# Patient Record
Sex: Female | Born: 1970 | Race: White | Hispanic: No | Marital: Married | Smoking: Never smoker
Health system: Southern US, Community
[De-identification: ages and names within clinical notes are randomized; demographics above are authoritative.]

---

## 2000-04-04 ENCOUNTER — Other Ambulatory Visit: Admission: RE | Admit: 2000-04-04 | Discharge: 2000-04-04 | Payer: Self-pay | Admitting: Obstetrics & Gynecology

## 2000-07-27 ENCOUNTER — Other Ambulatory Visit: Admission: RE | Admit: 2000-07-27 | Discharge: 2000-07-27 | Payer: Self-pay | Admitting: Obstetrics & Gynecology

## 2001-05-09 ENCOUNTER — Other Ambulatory Visit: Admission: RE | Admit: 2001-05-09 | Discharge: 2001-05-09 | Payer: Self-pay | Admitting: Obstetrics and Gynecology

## 2001-08-16 ENCOUNTER — Inpatient Hospital Stay (HOSPITAL_COMMUNITY): Admission: AD | Admit: 2001-08-16 | Discharge: 2001-08-16 | Payer: Self-pay | Admitting: Obstetrics and Gynecology

## 2001-08-17 ENCOUNTER — Encounter (INDEPENDENT_AMBULATORY_CARE_PROVIDER_SITE_OTHER): Payer: Self-pay | Admitting: Specialist

## 2001-08-17 ENCOUNTER — Inpatient Hospital Stay (HOSPITAL_COMMUNITY): Admission: AD | Admit: 2001-08-17 | Discharge: 2001-08-30 | Payer: Self-pay | Admitting: Obstetrics and Gynecology

## 2001-08-17 ENCOUNTER — Encounter: Payer: Self-pay | Admitting: Obstetrics and Gynecology

## 2001-08-21 ENCOUNTER — Encounter: Payer: Self-pay | Admitting: Obstetrics and Gynecology

## 2001-08-23 ENCOUNTER — Encounter: Payer: Self-pay | Admitting: Obstetrics and Gynecology

## 2001-08-27 ENCOUNTER — Encounter: Payer: Self-pay | Admitting: Obstetrics and Gynecology

## 2001-08-31 ENCOUNTER — Encounter: Admission: RE | Admit: 2001-08-31 | Discharge: 2001-09-30 | Payer: Self-pay | Admitting: Obstetrics and Gynecology

## 2002-10-23 ENCOUNTER — Other Ambulatory Visit: Admission: RE | Admit: 2002-10-23 | Discharge: 2002-10-23 | Payer: Self-pay | Admitting: Obstetrics and Gynecology

## 2003-10-23 ENCOUNTER — Other Ambulatory Visit: Admission: RE | Admit: 2003-10-23 | Discharge: 2003-10-23 | Payer: Self-pay | Admitting: Obstetrics and Gynecology

## 2003-11-04 ENCOUNTER — Encounter: Admission: RE | Admit: 2003-11-04 | Discharge: 2003-11-04 | Payer: Self-pay | Admitting: Obstetrics and Gynecology

## 2004-08-05 ENCOUNTER — Ambulatory Visit (HOSPITAL_COMMUNITY): Admission: RE | Admit: 2004-08-05 | Discharge: 2004-08-05 | Payer: Self-pay | Admitting: Obstetrics and Gynecology

## 2004-10-05 ENCOUNTER — Other Ambulatory Visit: Admission: RE | Admit: 2004-10-05 | Discharge: 2004-10-05 | Payer: Self-pay | Admitting: Obstetrics and Gynecology

## 2004-11-11 ENCOUNTER — Ambulatory Visit (HOSPITAL_COMMUNITY): Admission: RE | Admit: 2004-11-11 | Discharge: 2004-11-11 | Payer: Self-pay | Admitting: Obstetrics and Gynecology

## 2005-10-10 ENCOUNTER — Other Ambulatory Visit: Admission: RE | Admit: 2005-10-10 | Discharge: 2005-10-10 | Payer: Self-pay | Admitting: Obstetrics and Gynecology

## 2009-03-27 ENCOUNTER — Encounter: Admission: RE | Admit: 2009-03-27 | Discharge: 2009-03-27 | Payer: Self-pay | Admitting: Obstetrics and Gynecology

## 2010-04-09 ENCOUNTER — Encounter: Admission: RE | Admit: 2010-04-09 | Discharge: 2010-04-09 | Payer: Self-pay | Admitting: Family Medicine

## 2010-04-16 ENCOUNTER — Encounter: Admission: RE | Admit: 2010-04-16 | Discharge: 2010-04-16 | Payer: Self-pay | Admitting: Obstetrics and Gynecology

## 2011-03-29 ENCOUNTER — Other Ambulatory Visit: Payer: Self-pay | Admitting: Obstetrics and Gynecology

## 2011-03-29 DIAGNOSIS — Z1231 Encounter for screening mammogram for malignant neoplasm of breast: Secondary | ICD-10-CM

## 2011-05-13 NOTE — Discharge Summary (Signed)
[facility]  Patient:    Brandi Taylor, Brandi Taylor Visit Number: [id number] MRN: [id number]          Service Type: OBS Location: 910A 9140 01 Attending Physician:  Jaymes Graff A Dictated by:   Saverio Danker, C.N.M. Admit Date:  08/17/2001 Disc. Date: 08/30/01                             Discharge Summary  ADMISSION DIAGNOSES:          1. Intrauterine pregnancy at 24-1/7 weeks.                               2. Premature rupture of membranes.                               3. Complete placenta previa.                               4. Transverse lie.  DISCHARGE DIAGNOSES:          1. Intrauterine pregnancy at 24-1/7 weeks.                               2. Premature rupture of membranes.                               3. Complete placenta previa.                               4. Transverse lie.                               5. Nonreassuring fetal heart rate tracing.                               6. Complete oligohydramnios.                               7. No end diastolic flow on ultrasound.                               8. Status post classical cesarean section for                                  delivery. Infant in the neonatal intensive                                  care unit.                               9. Breast-feeding.  10. Anemia.  PROCEDURES THIS ADMISSION:    Primary classical cesarean section for delivery                               of a viable female infant named Brandi Taylor who weighed                               790 grams and had Apgars of 2 and 6 on                               August 27, 2001 by Dr. Dierdre Forth and                               Vance Gather Duplantis, C.N.M.  HOSPITAL COURSE:              Ms. Szeto is a 40 year old, married white female, gravida 1, para 0, admitted at 24-1/7 weeks with spontaneous rupture of the membranes and complete placenta previa. She was started on  magnesium sulfate therapy for tocolysis while she received betamethasone for fetal lung maturity. After approximately 48 hours on magnesium sulfate therapy, she developed pulmonary edema and required Lasix and that her magnesium sulfate be discontinued. She was subsequently maintained without magnesium sulfate and was on oxygen via nasal cannula to maintain her oxygen saturations greater than 95%. She continued status quo, continuing to leak fluid with very small amount of bleeding throughout August 28 to September 2. On the morning of September 2, she was noted to have variable decelerations with possible late component though contractions were difficult to trace, and once they were more accurately traced, it was felt that this pattern was nonreassuring. She was then given an ultrasound which had her biophysical profile of 0/8 and no end diastolic flow, and it was then recommended that she proceed with a cesarean section for delivery. She and her husband agreed and underwent the same with a classical incision on the uterus for delivery of a viable female infant, Brandi Taylor, who weighed 790 grams and had Apgars of 2 and 6 with a cord pH of 6.93 on August 27, 2001 attended by Dr. Dierdre Forth and Vance Gather Duplantis, C.N.M.  Postoperatively, Ms. Belshe has done well. She is ambulating, voiding, and eating without difficulty. She has significant anemia but it is asymptomatic with no syncope or dizziness. She had a temperature following delivery of 102.1 and was continued on Cefotan IV following delivery and has now been afebrile for greater than 48 hours. She is also ambulating and voiding and tolerating a regular diet without difficulty. She is breast pumping, also without difficulty. Her infant is in stable condition in the NICU. She is deemed ready for discharge today.  DISCHARGE INSTRUCTIONS:       Instructions are as per the [facility] handout.  DISCHARGE  MEDICATIONS:        1. Motrin 600 mg p.o. q.6h. p.r.n. for pain.                               2. Tylox one to two p.o. q.4-6h. p.r.n. for  pain.                               3. Prenatal vitamins daily.  DISCHARGE LABORATORIES:       Hemoglobin is 7.2, WBC count 19.1, and platelets are 343,000.  DISCHARGE FOLLOWUP:           The patient will follow up in four to six weeks at [facility] or p.r.n. Dictated by:   Vance Gather Duplantis, C.N.M. Attending Physician:  Michael Litter DD:  08/30/01 TD:  08/30/01 Job: [id number]

## 2011-05-13 NOTE — Op Note (Signed)
[facility]  Patient:    Brandi Taylor, Brandi Taylor Visit Number: [id number] MRN: [id number]          Service Type: OBS Location: 910A 9140 01 Attending Physician:  Jaymes Graff A Dictated by:   Maris Berger. Pennie Rushing, M.D. Proc. Date: 08/27/01 Admit Date:  08/17/2001                             Operative Report  PREOPERATIVE DIAGNOSES:       1. Intrauterine pregnancy at 25 weeks 4 days.                               2. Preterm premature rupture of membranes.                               3. Placenta previa.                               4. Second trimester bleeding.                               5. Nonreassuring fetal heart rate tracing.                               6. Abnormal Doppler studies.  POSTOPERATIVE DIAGNOSES:      1. Intrauterine pregnancy at 25 weeks 4 days.                               2. Preterm premature rupture of membranes.                               3. Placenta previa.                               4. Second trimester bleeding.                               5. Nonreassuring fetal heart rate tracing.                               6. Abnormal Doppler studies.  OPERATION:                    Primary classical cesarean section and uterine                               curettage.  SURGEON:                      Vanessa P. Pennie Rushing, M.D.  ASSISTANT:                    Myriam Forehand Duplantis, C.N.M.  ANESTHESIA:                   Combination spinal and epidural.  ESTIMATED BLOOD  LOSS:         1000 cc.  COMPLICATIONS:                None.  FINDINGS:                     The patient was delivered of a female infant whose name is Caprice Renshaw weighing 790 grams with Apgars of 2 and 6 at one and five minutes, respectively. The cord pH was 6.93. The uterus, tubes, and ovaries were normal for the gravid state. The placenta contained a marginally inserted cord with membranes detached. The membranous surface of the placenta was slightly green  stained. There was no amniotic fluid. The placenta was implanted posteriorly and across the internal os. The internal os digitally felt to be approximately 4 cm dilated.  DESCRIPTION OF PROCEDURE:     The patient was taken to the operating room after appropriate identification, and after a long discussion had been held with the patient and her husband, concerning the indications for the procedure as well as the risks involved which include, but are not limited to, anesthesia ______ , bleeding, infection, and damage to adjacent to adjacent organs. A particular concern about bleeding with a placenta previa was reviewed as was the probable need for a classical cesarean section because of the babys abnormal lie which this morning on ultrasound was oblique breech. The patient was placed on the operating table and a combination spinal and epidural anesthetic was placed. She was placed in the supine position with a left lateral tilt. The abdomen and perineum were prepped with multiple layers of Betadine. A Foley catheter was inserted into the bladder and connected to straight drainage. The abdomen was draped as a sterile field. After assurance of adequate anesthesia, a transverse incision was made in the abdomen and the abdomen opened in layers. The peritoneum was entered and the bladder blade placed. Evaluation of the placental position and the fetal position revealed that the infant was in oblique breech position with the head toward the maternal left. A midline incision was then made in the uterus down to the level of the endometrial cavity. No fluid egress was noted. The infant was then delivered from the footling breech position and after having the cord clamped and cut, was handed off to the awaiting pediatricians. The appropriate cord blood was drawn, and the placenta was noted to be adherent to the uterus. It was then manually removed and sharp curettage of the endometrial cavity  was undertaken to remove the membranous fragments. This was sent as a separate specimen. The uterus was then closed in two layers. The first layer contained figure-of-eight sutures of 0 Vicryl. A second imbricating layer of interrupted sutures of 0 Vicryl was then placed. Hemostatic sutures of 2-0 Vicryl were placed in the serosa. The serosa was closed with a running interlocking suture of 2-0 Vicryl. The uterus had been exteriorized in order to do the closure of the uterus and was then replaced into the peritoneal cavity. Copious irrigation was carried out and hemostasis was noted to be adequate. The abdominal peritoneum was closed with a running suture of 2-0 Vicryl. The rectus muscles were reapproximated in the midline with a figure-of-eight suture of 2-0 Vicryl. The rectus muscles were noted to be hemostatic and were irrigated. The rectus fascia was closed with a running suture of 0 Vicryl then reinforced on either side of midline with figure-of-eight sutures of 0 Vicryl. The subcutaneous tissue was irrigated  and made hemostatic with Bovie cautery. Skin staples were applied to the skin incision. A sterile dressing was applied. The patient was taken from the operating room to the recovery room in satisfactory condition having tolerated the procedure well with sponge and instrument counts correct. The infant went to the neonatal intensive care unit in guarded condition. Dictated by:   Maris Berger. Pennie Rushing, M.D. Attending Physician:  Michael Litter DD:  08/27/01 TD:  08/27/01 Job: [id number]

## 2011-05-13 NOTE — H&P (Signed)
[facility]  Patient:    Brandi Taylor, Brandi Taylor Visit Number: [id number] MRN: [id number]          Service Type: OBS Location: [facility] Attending Physician:  Jaymes Graff A Dictated by:   Nigel Bridgeman, C.N.M. Adm. Date:  08/17/2001                           History and Physical  DATE OF BIRTH:                Jun 11, 1971  HISTORY OF PRESENT ILLNESS:   Brandi Taylor is a 40 year old gravida 1, para 0 at 24-1/7 weeks, who presents tonight with episodes of leaking since 4 p.m. She denies any pain and has a small amount of serosanguineous discharge.  Her pregnancy has been remarkable for:  1. Marginal previa; 2. History of infertility.  The patient was seen yesterday in maternity admissions for contractions.  She was found to be having no bleeding and was discharged home.  PRENATAL LABORATORIES:        Will be dictated separately.  HISTORY OF PRESENT PREGNANCY: The patient entered care at approximately 10 weeks.  She had an ultrasound at 18 weeks, which showed a marginal previa. Ultrasound was repeated at approximately 23 weeks, which showed the same findings.  The patient had had one episodes previously of spotting, but has had no issues since that time.  OBSTETRICAL HISTORY:          The patient is a primigravida.  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 She had a repeat Pap in August 2001, which was normal.  She reports the usual childhood illnesses.  She had a lymph node removed from the right side of her neck in 1993 due to an infection.  She has no known medication allergies.  SURGICAL HISTORY:             The right lymph node removal.  FAMILY HISTORY:               Her sister had PIH.  Her paternal grandfather had a heart attack.  Her father had a heart murmur.  Her mother had ovarian cancer.  Her maternal aunt had a stroke.  GENETIC HISTORY:              Remarkable for patients father having a  heart murmur and her mother having multiple sclerosis.  SOCIAL HISTORY:               The patient is married to the father of the baby, he is involved and supportive.  His name is Doctor, general practice.  The patient is Caucasian.  She has two years of college.  She is employed at [employer].  Her husband also has two years of college and he is employed in [employer].  She has been followed initially by the certified nurse midwife service, [place].  She denies any alcohol, drug or tobacco use during this pregnancy.  PHYSICAL EXAMINATION:  VITAL SIGNS:                  Stable.  Patient is afebrile.  HEENT:                        Within normal limits.  LUNGS:  Bilateral breath sounds are clear.  HEART:                        Regular rate and rhythm without murmur.  BREASTS:                      Soft and nontender.  ABDOMEN:                      Fundal height is approximately 24 week size. Abdomen is soft and nontender.  Fetal heart rate initially was 160 to 170 with mild variables noted.  This did settle down to a baseline of 150 to 160 after rest.  There were still some occasional mild variables noted.  Uterine contractions were noted to be approximately every 7 minutes and were mild with irritability in between.  The patient was not aware of these contractions. These did space out a bit after IV fluid hydration, but then they did resume.   CERVICAL EXAMINATION:         Sterile speculum examination revealed positive fluid leaking from the vagina that was clear.  Crist Fat was negative, although there was a lot of RBCs on the discharge.  However, there was a moderate amount of a thin serosanguineous drainage noted in the vaginal vault.  The cervix appeared to be closed.  There was no active bleeding noted.  Ultrasound revealed a single intrauterine pregnancy in transverse lie with an AFI of 3.0, which represented less than a fifth percentile.  Cervix was 3.6 cm  long. There was a complete previa noted.  EXTREMITIES:                  Deep tendon reflexes are 2+ without clonus. There was a trace edema noted.  IMPRESSION:                   1. Intrauterine pregnancy at 24-1/7 weeks.                               2. Premature rupture of membranes.                               3. Complete previa.                               4. Transverse lie.  PLAN:                         1. Admit to [facility] per                                  consult with Dr. ______ Normand Sloop as attending                                  physician.                               2. Complete bedrest with Foley catheter  3. Magnesium sulfate therapy.                               4. Antibiotic therapy.                               5. Dexamethasone regimen.                               6. GC, chlamydia and group B strep cultures were                                  sent.                               7. NICU consult.                               8. Issues of prematurity and risk of infection,                                  early delivery, and risks of tocolysis were                                  reviewed with the patient and her husband by                                  Nigel Bridgeman, C.N.M. and Dr. ______ Normand Sloop                                  as attending physician.  The patient and her                                  husband did wish to proceed with tocolysis                                  and maintenance of the pregnancy. Dictated by:   Nigel Bridgeman, C.N.M. Attending Physician:  Michael Litter DD:  08/18/01 TD:  08/18/01 Job: [id number]

## 2011-05-20 ENCOUNTER — Ambulatory Visit
Admission: RE | Admit: 2011-05-20 | Discharge: 2011-05-20 | Disposition: A | Discharge status: Home / Self Care | Payer: 59 | Source: Ambulatory Visit | Attending: Obstetrics and Gynecology | Admitting: Obstetrics and Gynecology

## 2011-05-20 DIAGNOSIS — Z1231 Encounter for screening mammogram for malignant neoplasm of breast: Secondary | ICD-10-CM

## 2011-05-24 ENCOUNTER — Other Ambulatory Visit: Payer: Self-pay | Admitting: Obstetrics and Gynecology

## 2011-05-24 DIAGNOSIS — R928 Other abnormal and inconclusive findings on diagnostic imaging of breast: Secondary | ICD-10-CM

## 2011-06-01 ENCOUNTER — Ambulatory Visit
Admission: RE | Admit: 2011-06-01 | Discharge: 2011-06-01 | Disposition: A | Discharge status: Home / Self Care | Payer: 59 | Source: Ambulatory Visit | Attending: Obstetrics and Gynecology | Admitting: Obstetrics and Gynecology

## 2011-06-01 DIAGNOSIS — R928 Other abnormal and inconclusive findings on diagnostic imaging of breast: Secondary | ICD-10-CM

## 2012-04-29 ENCOUNTER — Other Ambulatory Visit: Payer: Self-pay | Admitting: Obstetrics and Gynecology

## 2012-05-03 ENCOUNTER — Telehealth: Payer: Self-pay | Admitting: Obstetrics and Gynecology

## 2012-05-03 NOTE — Telephone Encounter (Signed)
Left msg  On pt's voice mail to call back rgd rx refill request from [facility].

## 2012-05-03 NOTE — Telephone Encounter (Signed)
Spoke with rgd  rx refill request from [facility] . Pt stated that she is requesting that rx. Approved rx refill and faxed back to the pharmacy. Tilia Fe tab disp 28 sig 1 po qd with 0 refills. Pt has aex with vl on 05/09/12. Pt aware of rx refill and voice understanding. bt cma

## 2012-05-09 ENCOUNTER — Encounter: Payer: Self-pay | Admitting: Obstetrics and Gynecology

## 2012-05-09 ENCOUNTER — Ambulatory Visit (INDEPENDENT_AMBULATORY_CARE_PROVIDER_SITE_OTHER): Payer: 59 | Admitting: Obstetrics and Gynecology

## 2012-05-09 VITALS — BP 107/70 | Resp 16 | Ht 69.0 in | Wt 154.0 lb

## 2012-05-09 DIAGNOSIS — Z01419 Encounter for gynecological examination (general) (routine) without abnormal findings: Secondary | ICD-10-CM

## 2012-05-09 DIAGNOSIS — N63 Unspecified lump in unspecified breast: Secondary | ICD-10-CM

## 2012-05-09 DIAGNOSIS — N9089 Other specified noninflammatory disorders of vulva and perineum: Secondary | ICD-10-CM

## 2012-05-09 DIAGNOSIS — N6312 Unspecified lump in the right breast, upper inner quadrant: Secondary | ICD-10-CM | POA: Insufficient documentation

## 2012-05-09 MED ORDER — NORETHINDRON-ETHINYL ESTRAD-FE 1-20/1-30/1-35 MG-MCG PO TABS: 1.0000 | ORAL_TABLET | Freq: Every day | ORAL | Status: AC

## 2012-05-09 MED ORDER — NYSTATIN-TRIAMCINOLONE 100000-0.1 UNIT/GM-% EX OINT: TOPICAL_OINTMENT | Freq: Two times a day (BID) | CUTANEOUS | Status: AC

## 2012-05-09 NOTE — Progress Notes (Signed)
Contraception: Estrostep-Fe Last pap: 03/2011-Normal Last Mammo: 2012-Benign Cyst Last Colonoscopy: Never had Last Dexa Scan: Never had Primary MD: None Abuse at Home: None

## 2012-05-09 NOTE — Progress Notes (Signed)
Subjective:    Brandi Taylor is a 41 y.o. female, No obstetric history on file., who presents for an annual exam.  Has history of right breast cyst on US/mammogram 2012--feels cyst has enlarged over the last month. Son is doing well, 5th grade--born at 24 weeks due to patient's HELLP syndrome.  Wants to continue Estrostep, also wants Mycolog refilled for episodic use.    History   Social History  . Marital Status: Married    Spouse Name: N/A    Number of Children: N/A  . Years of Education: N/A   Social History Main Topics  . Smoking status: Never Smoker   . Smokeless tobacco: Never Used  . Alcohol Use: No  . Drug Use: No  . Sexually Active: Yes    Birth Control/ Protection: Pill   Other Topics Concern  . Not on file   Social History Narrative  . No narrative on file    Menstrual cycle:   LMP: Patient's last menstrual period was 04/29/2012.           Cycle: Regular, monthly with normal flow and no severe dysmenorrha  The following portions of the patient's history were reviewed and updated as appropriate: allergies, current medications, past family history, past medical history, past social history, past surgical history and problem list.  Review of Systems Pertinent items are noted in HPI. Breast:Negative for breast lump,nipple discharge or nipple retraction Gastrointestinal: Negative for abdominal pain, change in bowel habits or rectal bleeding Urinary:negative   Objective:    BP 107/70  Resp 16  Ht 5\' 9"  (1.753 m)  Wt 154 lb (69.854 kg)  BMI 22.74 kg/m2  LMP 04/29/2012    Weight:  Wt Readings from Last 1 Encounters:  05/09/12 154 lb (69.854 kg)          BMI: Body mass index is 22.74 kg/(m^2).  General Appearance: Alert, appropriate appearance for age. No acute distress HEENT: Grossly normal Neck / Thyroid: Supple, no masses, nodes or enlargement Lungs: clear to auscultation bilaterally Back: No CVA tenderness Breast Exam: Breast mass on right, with  approximately 3 cm mass at 1 o'clock adjacent to nipple.  No nipple discharge.  Mass is mobile and NT.  There is no retraction or dimpling of the skin over the breast. Cardiovascular: Regular rate and rhythm. S1, S2, no murmur Gastrointestinal: Soft, non-tender, no masses or organomegaly Pelvic Exam: Vulva and vagina appear normal. Bimanual exam reveals normal uterus and adnexa. Rectovaginal: No masses  Lymphatic Exam: Non-palpable nodes in neck, clavicular, axillary, or inguinal regions  Skin: no rash or abnormalities Neurologic: Normal gait and speech, no tremor  Psychiatric: Alert and oriented, appropriate affect.   Wet Prep:not applicable Urinalysis:not applicable UPT: Not done   Assessment:    Normal pelvic exam   Mass in right breast Wants to continue Estrostep Needs refill on Mycolog for episodic vulvar irritation  Plan:    Mammogram--will order diagnostic mammogram with need for [place] determined by the [facility] Reviewed with patient the option of mass removal, even if Breast Center assessment does not include removal as recommendation. Would refer patient to general surgeon for removal, if she desires, once assessment is made at Breast Center Pap smear done Return annually or prn--will follow-up regarding findings of mammogram. STD screening: declined Contraception:oral contraceptives (estrogen/progesterone)--Will refill Estrostep x 1 year Refill Mycolog for 1 year.      Nigel Bridgeman, CNM, [place]

## 2012-05-11 LAB — PAP IG W/ RFLX HPV ASCU

## 2012-05-16 ENCOUNTER — Other Ambulatory Visit: Payer: Self-pay | Admitting: Obstetrics and Gynecology

## 2012-05-16 ENCOUNTER — Ambulatory Visit
Admission: RE | Admit: 2012-05-16 | Discharge: 2012-05-16 | Disposition: A | Discharge status: Home / Self Care | Payer: 59 | Source: Ambulatory Visit | Attending: Obstetrics and Gynecology | Admitting: Obstetrics and Gynecology

## 2012-05-16 DIAGNOSIS — N6312 Unspecified lump in the right breast, upper inner quadrant: Secondary | ICD-10-CM

## 2012-05-24 ENCOUNTER — Encounter: Payer: Self-pay | Admitting: Obstetrics and Gynecology

## 2013-04-16 ENCOUNTER — Other Ambulatory Visit: Payer: Self-pay

## 2013-04-16 DIAGNOSIS — Z1231 Encounter for screening mammogram for malignant neoplasm of breast: Secondary | ICD-10-CM

## 2013-05-17 ENCOUNTER — Ambulatory Visit
Admission: RE | Admit: 2013-05-17 | Discharge: 2013-05-17 | Disposition: A | Discharge status: Home / Self Care | Payer: 59 | Source: Ambulatory Visit

## 2013-05-17 DIAGNOSIS — Z1231 Encounter for screening mammogram for malignant neoplasm of breast: Secondary | ICD-10-CM

## 2014-04-09 ENCOUNTER — Other Ambulatory Visit: Payer: Self-pay

## 2014-04-09 DIAGNOSIS — Z1231 Encounter for screening mammogram for malignant neoplasm of breast: Secondary | ICD-10-CM

## 2014-05-20 ENCOUNTER — Encounter (INDEPENDENT_AMBULATORY_CARE_PROVIDER_SITE_OTHER): Payer: Self-pay

## 2014-05-20 ENCOUNTER — Ambulatory Visit
Admission: RE | Admit: 2014-05-20 | Discharge: 2014-05-20 | Disposition: A | Discharge status: Home / Self Care | Payer: 59 | Source: Ambulatory Visit

## 2014-05-20 DIAGNOSIS — Z1231 Encounter for screening mammogram for malignant neoplasm of breast: Secondary | ICD-10-CM

## 2015-04-09 ENCOUNTER — Other Ambulatory Visit: Payer: Self-pay

## 2015-04-09 DIAGNOSIS — Z1231 Encounter for screening mammogram for malignant neoplasm of breast: Secondary | ICD-10-CM

## 2015-05-22 ENCOUNTER — Ambulatory Visit
Admission: RE | Admit: 2015-05-22 | Discharge: 2015-05-22 | Disposition: A | Discharge status: Home / Self Care | Payer: 59 | Source: Ambulatory Visit

## 2015-05-22 DIAGNOSIS — Z1231 Encounter for screening mammogram for malignant neoplasm of breast: Secondary | ICD-10-CM

## 2016-04-18 ENCOUNTER — Other Ambulatory Visit: Payer: Self-pay

## 2016-04-18 DIAGNOSIS — Z1231 Encounter for screening mammogram for malignant neoplasm of breast: Secondary | ICD-10-CM

## 2016-05-20 ENCOUNTER — Ambulatory Visit
Admission: RE | Admit: 2016-05-20 | Discharge: 2016-05-20 | Disposition: A | Discharge status: Home / Self Care | Payer: 59 | Source: Ambulatory Visit

## 2016-05-20 DIAGNOSIS — Z1231 Encounter for screening mammogram for malignant neoplasm of breast: Secondary | ICD-10-CM

## 2017-06-05 ENCOUNTER — Other Ambulatory Visit: Payer: Self-pay | Admitting: Obstetrics and Gynecology

## 2017-06-05 DIAGNOSIS — Z1231 Encounter for screening mammogram for malignant neoplasm of breast: Secondary | ICD-10-CM

## 2017-07-07 ENCOUNTER — Ambulatory Visit
Admission: RE | Admit: 2017-07-07 | Discharge: 2017-07-07 | Disposition: A | Discharge status: Home / Self Care | Payer: 59 | Source: Ambulatory Visit | Attending: Obstetrics and Gynecology | Admitting: Obstetrics and Gynecology

## 2017-07-07 DIAGNOSIS — Z1231 Encounter for screening mammogram for malignant neoplasm of breast: Secondary | ICD-10-CM

## 2018-06-06 ENCOUNTER — Other Ambulatory Visit: Payer: Self-pay | Admitting: Obstetrics and Gynecology

## 2018-06-06 DIAGNOSIS — Z1231 Encounter for screening mammogram for malignant neoplasm of breast: Secondary | ICD-10-CM

## 2018-07-13 ENCOUNTER — Ambulatory Visit
Admission: RE | Admit: 2018-07-13 | Discharge: 2018-07-13 | Disposition: A | Discharge status: Home / Self Care | Payer: 59 | Source: Ambulatory Visit | Attending: Obstetrics and Gynecology | Admitting: Obstetrics and Gynecology

## 2018-07-13 DIAGNOSIS — Z1231 Encounter for screening mammogram for malignant neoplasm of breast: Secondary | ICD-10-CM

## 2019-07-10 ENCOUNTER — Other Ambulatory Visit: Payer: Self-pay | Admitting: Obstetrics and Gynecology

## 2019-07-10 ENCOUNTER — Other Ambulatory Visit: Payer: Self-pay | Admitting: Family Medicine

## 2019-07-10 DIAGNOSIS — Z1231 Encounter for screening mammogram for malignant neoplasm of breast: Secondary | ICD-10-CM

## 2019-07-19 ENCOUNTER — Ambulatory Visit
Admission: RE | Admit: 2019-07-19 | Discharge: 2019-07-19 | Disposition: A | Discharge status: Home / Self Care | Payer: 59 | Source: Ambulatory Visit

## 2019-07-19 ENCOUNTER — Other Ambulatory Visit: Payer: Self-pay

## 2019-07-19 DIAGNOSIS — Z1231 Encounter for screening mammogram for malignant neoplasm of breast: Secondary | ICD-10-CM

## 2020-05-01 ENCOUNTER — Encounter: Payer: Self-pay | Admitting: Gastroenterology

## 2020-06-24 ENCOUNTER — Other Ambulatory Visit: Payer: Self-pay | Admitting: Obstetrics and Gynecology

## 2020-06-24 DIAGNOSIS — Z1231 Encounter for screening mammogram for malignant neoplasm of breast: Secondary | ICD-10-CM

## 2020-06-26 DIAGNOSIS — F53 Postpartum depression: Secondary | ICD-10-CM | POA: Insufficient documentation

## 2020-06-26 DIAGNOSIS — D649 Anemia, unspecified: Secondary | ICD-10-CM | POA: Insufficient documentation

## 2020-06-26 DIAGNOSIS — N83209 Unspecified ovarian cyst, unspecified side: Secondary | ICD-10-CM | POA: Insufficient documentation

## 2020-07-03 ENCOUNTER — Ambulatory Visit: Payer: 59

## 2020-07-03 ENCOUNTER — Other Ambulatory Visit: Payer: Self-pay

## 2020-07-03 VITALS — Ht 69.0 in | Wt 181.8 lb

## 2020-07-03 DIAGNOSIS — Z1211 Encounter for screening for malignant neoplasm of colon: Secondary | ICD-10-CM

## 2020-07-03 MED ORDER — SUTAB 1479-225-188 MG PO TABS: 1.0000 | ORAL_TABLET | ORAL | 0 refills | Status: DC

## 2020-07-03 NOTE — Progress Notes (Signed)
Denies allergies to eggs or soy products. Denies complication of anesthesia or sedation. Denies use of weight loss medication. Denies use of O2.   Emmi instructions given for colonoscopy.  

## 2020-07-10 ENCOUNTER — Encounter: Payer: Self-pay | Admitting: Gastroenterology

## 2020-07-13 ENCOUNTER — Telehealth: Payer: Self-pay

## 2020-07-13 NOTE — Telephone Encounter (Signed)
Pt upsey her husband was given a sample of Sutab back in April and she was given a coupon to have to pay $40- she said she didnt understand why they were treated different-I explained to her I did not know the circumstances as to why he was given a Sample but the coupon would cover it for $40- she was not happy with that and wants a sample like he got - she doesn't want to pay $40 as he didn't have to   Will give sampleNila Nephew [id number] exp 12/2021  Pt to pick up 3rd floor desk marie pV

## 2020-07-13 NOTE — Telephone Encounter (Signed)
[facility]- Marie PV

## 2020-07-24 ENCOUNTER — Ambulatory Visit (AMBULATORY_SURGERY_CENTER): Payer: 59 | Admitting: Gastroenterology

## 2020-07-24 ENCOUNTER — Encounter: Payer: Self-pay | Admitting: Gastroenterology

## 2020-07-24 ENCOUNTER — Other Ambulatory Visit: Payer: Self-pay

## 2020-07-24 VITALS — BP 142/96 | HR 80 | Temp 97.7°F | Resp 19 | Ht 69.0 in | Wt 181.0 lb

## 2020-07-24 DIAGNOSIS — D124 Benign neoplasm of descending colon: Secondary | ICD-10-CM | POA: Diagnosis not present

## 2020-07-24 DIAGNOSIS — Z1211 Encounter for screening for malignant neoplasm of colon: Secondary | ICD-10-CM | POA: Diagnosis not present

## 2020-07-24 DIAGNOSIS — D120 Benign neoplasm of cecum: Secondary | ICD-10-CM | POA: Diagnosis not present

## 2020-07-24 MED ORDER — SODIUM CHLORIDE 0.9 % IV SOLN: 500.0000 mL | Freq: Once | INTRAVENOUS | Status: DC

## 2020-07-24 NOTE — Patient Instructions (Signed)
Discharge instructions given. Handouts on polyps and Hemorrhoids. Resume previous medications. No aspirin,ibuprofen,or other anti-inflammatory drugs for 2 weeks. YOU HAD AN ENDOSCOPIC PROCEDURE TODAY AT [facility]:   Refer to the procedure report that was given to you for any specific questions about what was found during the examination.  If the procedure report does not answer your questions, please call your gastroenterologist to clarify.  If you requested that your care partner not be given the details of your procedure findings, then the procedure report has been included in a sealed envelope for you to review at your convenience later.  YOU SHOULD EXPECT: Some feelings of bloating in the abdomen. Passage of more gas than usual.  Walking can help get rid of the air that was put into your GI tract during the procedure and reduce the bloating. If you had a lower endoscopy (such as a colonoscopy or flexible sigmoidoscopy) you may notice spotting of blood in your stool or on the toilet paper. If you underwent a bowel prep for your procedure, you may not have a normal bowel movement for a few days.  Please Note:  You might notice some irritation and congestion in your nose or some drainage.  This is from the oxygen used during your procedure.  There is no need for concern and it should clear up in a day or so.  SYMPTOMS TO REPORT IMMEDIATELY:   Following lower endoscopy (colonoscopy or flexible sigmoidoscopy):  Excessive amounts of blood in the stool  Significant tenderness or worsening of abdominal pains  Swelling of the abdomen that is new, acute  Fever of 100F or higher  For urgent or emergent issues, a gastroenterologist can be reached at any hour by calling [phone]. Do not use MyChart messaging for urgent concerns.    DIET:  We do recommend a small meal at first, but then you may proceed to your regular diet.  Drink plenty of fluids but you should avoid alcoholic  beverages for 24 hours.  ACTIVITY:  You should plan to take it easy for the rest of today and you should NOT DRIVE or use heavy machinery until tomorrow (because of the sedation medicines used during the test).    FOLLOW UP: Our staff will call the number listed on your records 48-72 hours following your procedure to check on you and address any questions or concerns that you may have regarding the information given to you following your procedure. If we do not reach you, we will leave a message.  We will attempt to reach you two times.  During this call, we will ask if you have developed any symptoms of COVID 19. If you develop any symptoms (ie: fever, flu-like symptoms, shortness of breath, cough etc.) before then, please call [phone].  If you test positive for Covid 19 in the 2 weeks post procedure, please call and report this information to [place].    If any biopsies were taken you will be contacted by phone or by letter within the next 1-3 weeks.  Please call us at [phone] if you have not heard about the biopsies in 3 weeks.    SIGNATURES/CONFIDENTIALITY: You and/or your care partner have signed paperwork which will be entered into your electronic medical record.  These signatures attest to the fact that that the information above on your After Visit Summary has been reviewed and is understood.  Full responsibility of the confidentiality of this discharge information lies with you and/or your care-partner.

## 2020-07-24 NOTE — Progress Notes (Signed)
Called to room to assist during endoscopic procedure.  Patient ID and intended procedure confirmed with present staff. Received instructions for my participation in the procedure from the performing physician.  

## 2020-07-24 NOTE — Progress Notes (Signed)
Pt's states no medical or surgical changes since previsit or office visit. VS by CW. 

## 2020-07-24 NOTE — Op Note (Signed)
[facility] Patient Name: Brandi Taylor Procedure Date: 07/24/2020 10:33 AM MRN: [id number] Endoscopist: Ladene Artist , MD Age: 49 Referring MD:  Date of Birth: 17-Jun-1971 Gender: Female Account #: [id number] Procedure:                Colonoscopy Indications:              Screening for colorectal malignant neoplasm Medicines:                Monitored Anesthesia Care Procedure:                Pre-Anesthesia Assessment:                           - Prior to the procedure, a History and Physical                            was performed, and patient medications and                            allergies were reviewed. The patient's tolerance of                            previous anesthesia was also reviewed. The risks                            and benefits of the procedure and the sedation                            options and risks were discussed with the patient.                            All questions were answered, and informed consent                            was obtained. Prior Anticoagulants: The patient has                            taken no previous anticoagulant or antiplatelet                            agents. ASA Grade Assessment: II - A patient with                            mild systemic disease. After reviewing the risks                            and benefits, the patient was deemed in                            satisfactory condition to undergo the procedure.                           After obtaining informed consent, the colonoscope  was passed under direct vision. Throughout the                            procedure, the patient's blood pressure, pulse, and                            oxygen saturations were monitored continuously. The                            Colonoscope was introduced through the anus and                            advanced to the the cecum, identified by                            appendiceal orifice and  ileocecal valve. The                            ileocecal valve, appendiceal orifice, and rectum                            were photographed. The quality of the bowel                            preparation was excellent. The colonoscopy was                            performed without difficulty. The patient tolerated                            the procedure well. Scope In: 10:37:08 AM Scope Out: 10:54:54 AM Scope Withdrawal Time: 0 hours 13 minutes 48 seconds  Total Procedure Duration: 0 hours 17 minutes 46 seconds  Findings:                 The perianal and digital rectal examinations were                            normal.                           A 18 mm polyp was found in the cecum. The polyp was                            sessile. The polyp was removed with a hot snare.                            Resection and retrieval were complete.                           A 7 mm polyp was found in the descending colon. The                            polyp was sessile. The polyp was removed with a  cold snare. Resection and retrieval were complete.                           Internal hemorrhoids were found during                            retroflexion. The hemorrhoids were small and Grade                            I (internal hemorrhoids that do not prolapse).                           The exam was otherwise without abnormality on                            direct and retroflexion views. Complications:            No immediate complications. Estimated blood loss:                            None. Estimated Blood Loss:     Estimated blood loss: none. Impression:               - One 18 mm polyp in the cecum, removed with a hot                            snare. Resected and retrieved.                           - One 7 mm polyp in the descending colon, removed                            with a cold snare. Resected and retrieved.                           - Internal  hemorrhoids.                           - The examination was otherwise normal on direct                            and retroflexion views. Recommendation:           - Repeat colonoscopy, likely in 3 years, after                            studies are complete for surveillance based on                            pathology results.                           - Patient has a contact number available for                            emergencies. The signs and symptoms of potential  delayed complications were discussed with the                            patient. Return to normal activities tomorrow.                            Written discharge instructions were provided to the                            patient.                           - Resume previous diet.                           - Continue present medications.                           - Await pathology results.                           - No aspirin, ibuprofen, naproxen, or other                            non-steroidal anti-inflammatory drugs for 2 weeks                            after polyp removal. Ladene Artist, MD 07/24/2020 11:01:46 AM This report has been signed electronically.

## 2020-07-24 NOTE — Progress Notes (Signed)
Report to PACU, RN, vss, BBS= Clear.  

## 2020-07-28 ENCOUNTER — Telehealth: Payer: Self-pay

## 2020-07-28 NOTE — Telephone Encounter (Signed)
  Follow up Call-  Call back number 07/24/2020  Post procedure Call Back phone  # [phone]  Permission to leave phone message Yes  Some recent data might be hidden     Patient questions:  Do you have a fever, pain , or abdominal swelling? No. Pain Score  0 *  Have you tolerated food without any problems? Yes.    Have you been able to return to your normal activities? Yes.    Do you have any questions about your discharge instructions: Diet   No. Medications  No. Follow up visit  No.  Do you have questions or concerns about your Care? No.  Actions: * If pain score is 4 or above: No action needed, pain <4.  1. Have you developed a fever since your procedure? No  2.   Have you had an respiratory symptoms (SOB or cough) since your procedure? No  3.   Have you tested positive for COVID 19 since your procedure No  4.   Have you had any family members/close contacts diagnosed with the COVID 19 since your procedure?  No   If yes to any of these questions please route to Joylene John, RN and Erenest Rasher, RN   2. Have you developed a fever since your procedure? No  2.   Have you had an respiratory symptoms (SOB or cough) since your procedure? No 3.   Have you tested positive for COVID 19 since your procedure No  4.   Have you had any family members/close contacts diagnosed with the COVID 19 since your procedure?  No   If yes to any of these questions please route to Joylene John, RN and Erenest Rasher, RN

## 2020-07-31 ENCOUNTER — Other Ambulatory Visit: Payer: Self-pay

## 2020-07-31 ENCOUNTER — Ambulatory Visit
Admission: RE | Admit: 2020-07-31 | Discharge: 2020-07-31 | Disposition: A | Discharge status: Home / Self Care | Payer: 59 | Source: Ambulatory Visit

## 2020-07-31 DIAGNOSIS — Z1231 Encounter for screening mammogram for malignant neoplasm of breast: Secondary | ICD-10-CM

## 2020-08-02 ENCOUNTER — Encounter: Payer: Self-pay | Admitting: Gastroenterology

## 2021-07-15 ENCOUNTER — Other Ambulatory Visit: Payer: Self-pay | Admitting: Obstetrics and Gynecology

## 2021-07-15 DIAGNOSIS — Z1231 Encounter for screening mammogram for malignant neoplasm of breast: Secondary | ICD-10-CM

## 2021-08-02 ENCOUNTER — Ambulatory Visit
Admission: RE | Admit: 2021-08-02 | Discharge: 2021-08-02 | Disposition: A | Discharge status: Home / Self Care | Payer: 59 | Source: Ambulatory Visit

## 2021-08-02 ENCOUNTER — Other Ambulatory Visit: Payer: Self-pay

## 2021-08-02 DIAGNOSIS — Z1231 Encounter for screening mammogram for malignant neoplasm of breast: Secondary | ICD-10-CM

## 2022-06-21 ENCOUNTER — Other Ambulatory Visit: Payer: Self-pay | Admitting: Obstetrics and Gynecology

## 2022-06-21 DIAGNOSIS — Z1231 Encounter for screening mammogram for malignant neoplasm of breast: Secondary | ICD-10-CM

## 2022-08-05 DIAGNOSIS — Z1231 Encounter for screening mammogram for malignant neoplasm of breast: Secondary | ICD-10-CM

## 2022-08-19 ENCOUNTER — Ambulatory Visit
Admission: RE | Admit: 2022-08-19 | Discharge: 2022-08-19 | Disposition: A | Discharge status: Home / Self Care | Payer: 59 | Source: Ambulatory Visit | Attending: Obstetrics and Gynecology | Admitting: Obstetrics and Gynecology

## 2022-08-19 DIAGNOSIS — Z1231 Encounter for screening mammogram for malignant neoplasm of breast: Secondary | ICD-10-CM

## 2023-01-11 IMAGING — MG MM DIGITAL SCREENING BILAT W/ TOMO AND CAD
6 of 10 series · 6 of 30 positions shown · non-contrast
Comparison: Previous exam(s).

CLINICAL DATA: Screening.

EXAM:
DIGITAL SCREENING BILATERAL MAMMOGRAM WITH TOMOSYNTHESIS AND CAD
TECHNIQUE: Bilateral screening digital craniocaudal and mediolateral oblique
mammograms were obtained. Bilateral screening digital breast
tomosynthesis was performed. The images were evaluated with
computer-aided detection.

[L CC synth-2D]
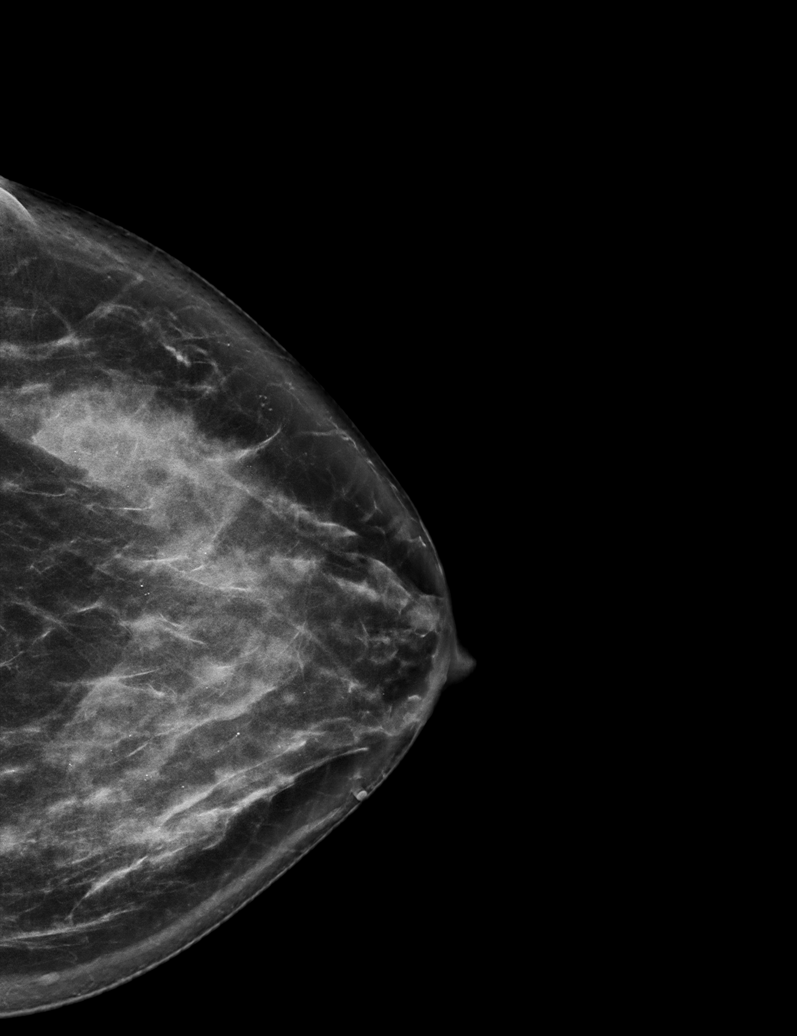

[L MLO synth-2D]
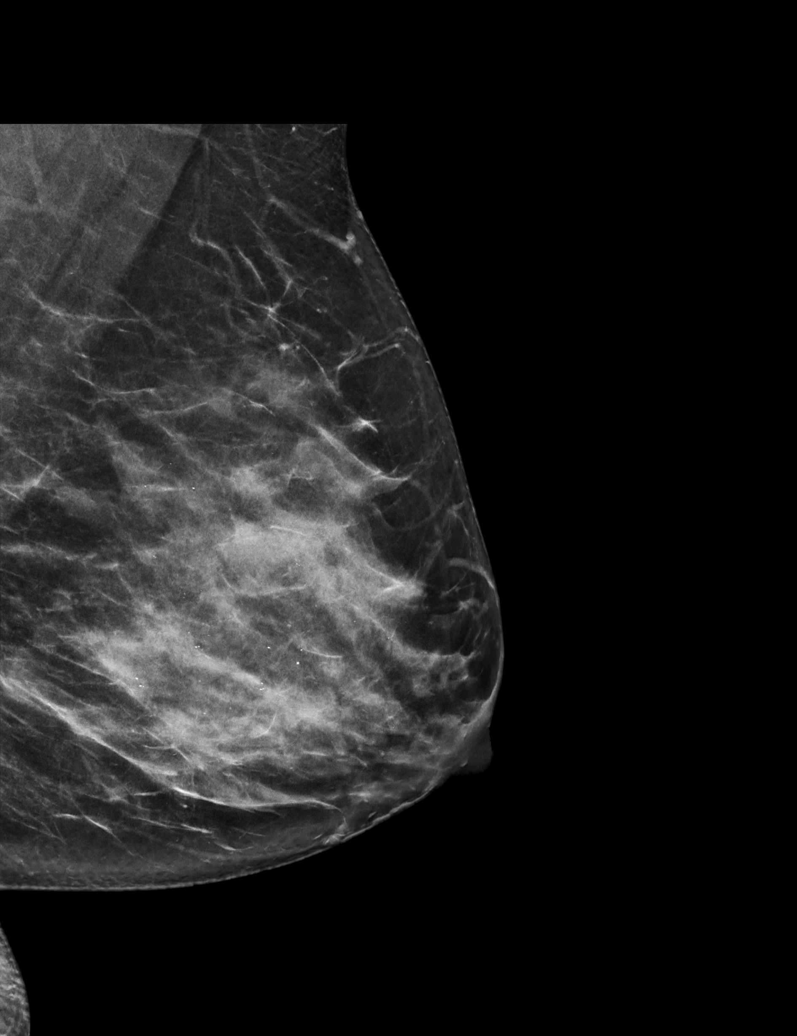

[R CC synth-2D (1 of 2)]
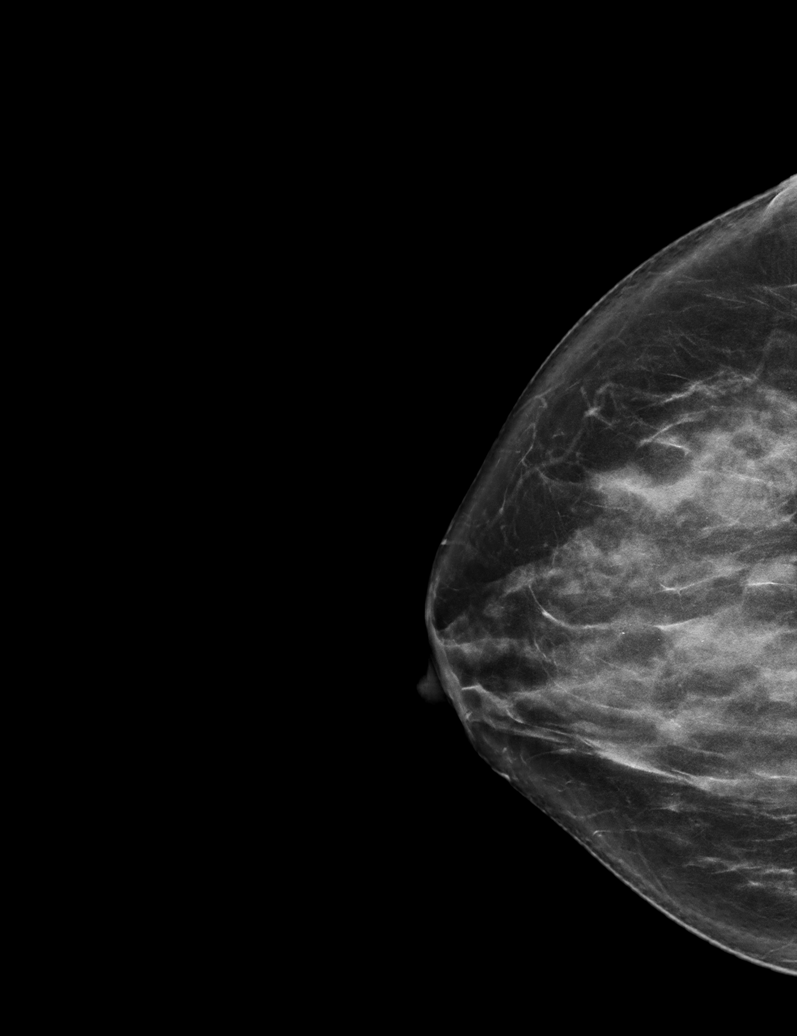

[R MLO synth-2D]
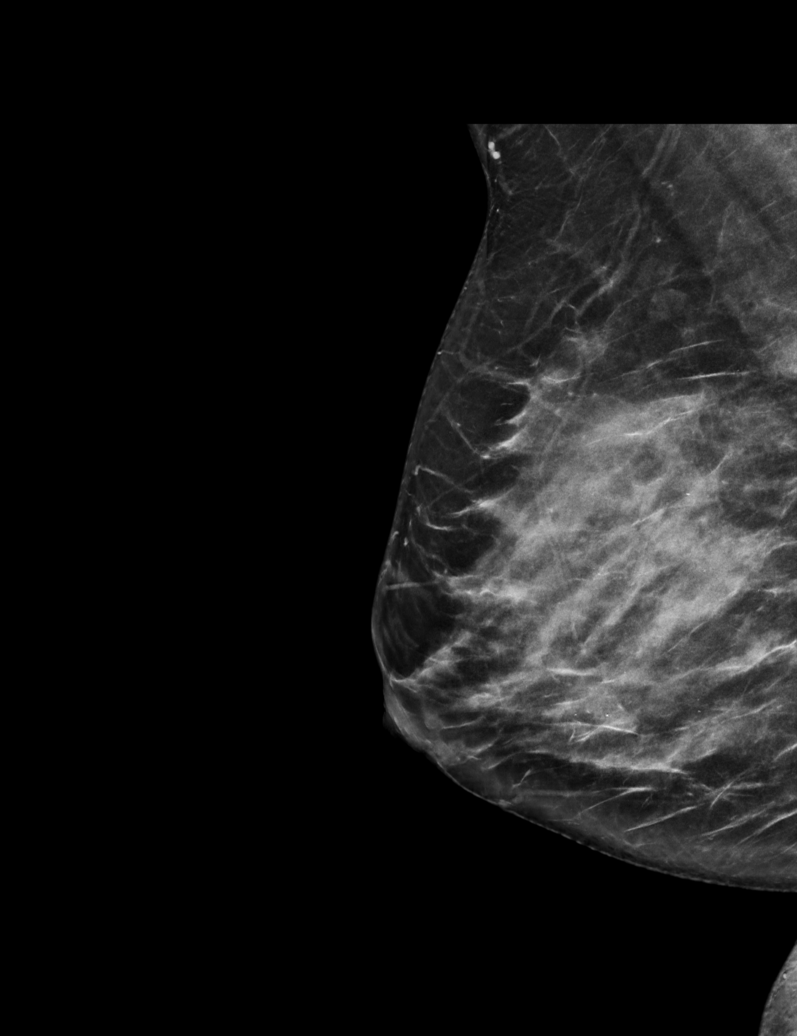

[R CC synth-2D (2 of 2)]
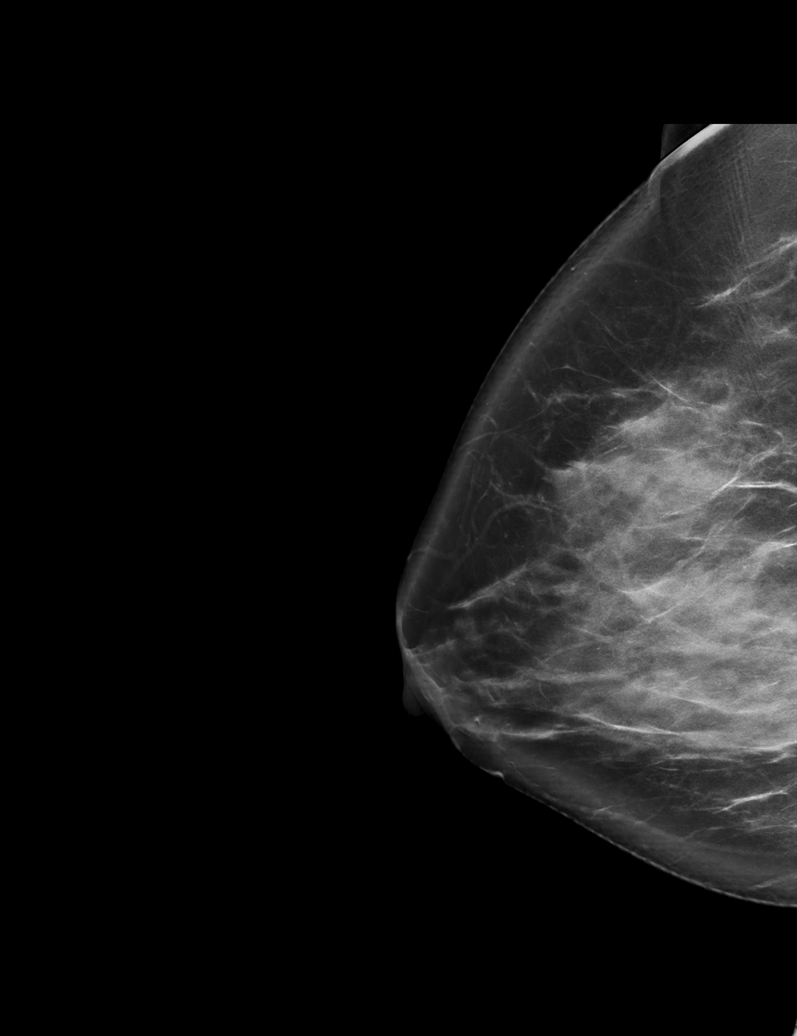

[R MLO tomo · tomo slice 35/69.0]
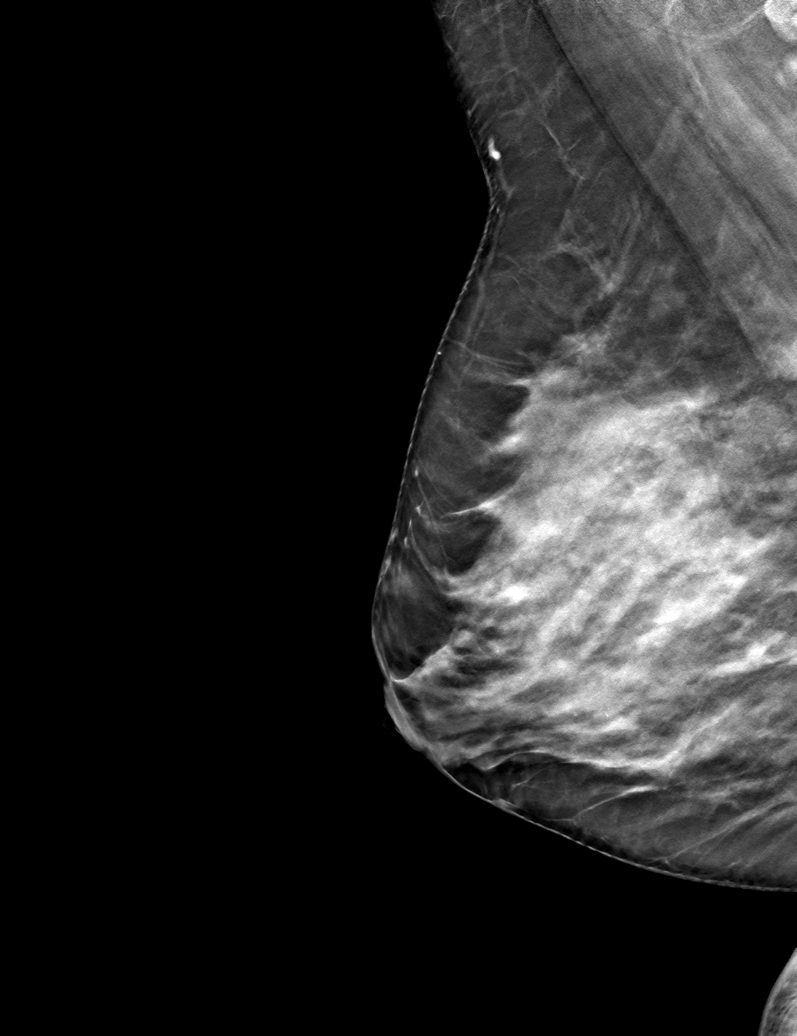

[6 of 30 positions shown; findings below may reference images not displayed]

ACR Breast Density Category c: The breast tissue is heterogeneously
dense, which may obscure small masses.
FINDINGS: There are no findings suspicious for malignancy.
IMPRESSION: No mammographic evidence of malignancy. A result letter of this
screening mammogram will be mailed directly to the patient.

RECOMMENDATION:
Screening mammogram in one year. (Code:Q3-W-BC3)

BI-RADS CATEGORY  1: Negative.

## 2023-05-24 ENCOUNTER — Encounter: Payer: Self-pay | Admitting: Gastroenterology

## 2023-07-13 ENCOUNTER — Encounter: Payer: Self-pay | Admitting: Gastroenterology

## 2023-07-24 ENCOUNTER — Other Ambulatory Visit: Payer: Self-pay | Admitting: Obstetrics and Gynecology

## 2023-07-24 DIAGNOSIS — Z1231 Encounter for screening mammogram for malignant neoplasm of breast: Secondary | ICD-10-CM

## 2023-08-22 ENCOUNTER — Ambulatory Visit
Admission: RE | Admit: 2023-08-22 | Discharge: 2023-08-22 | Disposition: A | Discharge status: Home / Self Care | Payer: 59 | Source: Ambulatory Visit

## 2023-08-22 DIAGNOSIS — Z1231 Encounter for screening mammogram for malignant neoplasm of breast: Secondary | ICD-10-CM

## 2023-11-03 ENCOUNTER — Ambulatory Visit (AMBULATORY_SURGERY_CENTER): Payer: 59

## 2023-11-03 VITALS — Ht 68.0 in | Wt 185.0 lb

## 2023-11-03 DIAGNOSIS — Z860100 Personal history of colon polyps, unspecified: Secondary | ICD-10-CM

## 2023-11-03 MED ORDER — SUTAB 1479-225-188 MG PO TABS: 12.0000 | ORAL_TABLET | ORAL | 0 refills | Status: DC

## 2023-11-03 NOTE — Progress Notes (Signed)
No egg or soy allergy known to patient  No issues known to pt with past sedation with any surgeries or procedures Patient denies ever being told they had issues or difficulty with intubation  No FH of Malignant Hyperthermia Pt is not on diet pills Pt is not on  home 02  Pt is not on blood thinners  Pt denies issues with constipation  No A fib or A flutter Have any cardiac testing pending-- no  LOA: independent  Prep: sutab  Patient's chart reviewed by Cathlyn Parsons CNRA prior to previsit and patient appropriate for the [facility].  Previsit completed and red dot placed by patient's name on their procedure day (on provider's schedule).     PV competed with patient. Prep instructions sent via mychart and home address.   SUTAB coupon code given to patient over the phone to present to pharmacy. Patient made aware prep is not covered by her insurance but the coupon can reduced the cost to around $60.00 pt is agreeable.

## 2023-11-08 ENCOUNTER — Encounter: Payer: Self-pay | Admitting: Gastroenterology

## 2023-11-17 ENCOUNTER — Encounter: Payer: 59 | Admitting: Gastroenterology

## 2023-11-21 ENCOUNTER — Ambulatory Visit: Payer: 59 | Admitting: Gastroenterology

## 2023-11-21 ENCOUNTER — Encounter: Payer: Self-pay | Admitting: Gastroenterology

## 2023-11-21 VITALS — BP 141/84 | HR 75 | Temp 97.5°F | Resp 11 | Ht 68.0 in | Wt 185.0 lb

## 2023-11-21 DIAGNOSIS — Z860101 Personal history of adenomatous and serrated colon polyps: Secondary | ICD-10-CM | POA: Diagnosis not present

## 2023-11-21 DIAGNOSIS — Z800 Family history of malignant neoplasm of digestive organs: Secondary | ICD-10-CM

## 2023-11-21 DIAGNOSIS — Z860100 Personal history of colon polyps, unspecified: Secondary | ICD-10-CM

## 2023-11-21 DIAGNOSIS — D123 Benign neoplasm of transverse colon: Secondary | ICD-10-CM

## 2023-11-21 DIAGNOSIS — D124 Benign neoplasm of descending colon: Secondary | ICD-10-CM

## 2023-11-21 DIAGNOSIS — Z1211 Encounter for screening for malignant neoplasm of colon: Secondary | ICD-10-CM | POA: Diagnosis present

## 2023-11-21 MED ORDER — SODIUM CHLORIDE 0.9 % IV SOLN: 500.0000 mL | Freq: Once | INTRAVENOUS | Status: DC

## 2023-11-21 NOTE — Progress Notes (Signed)
Pt's states no medical or surgical changes since previsit or office visit. 

## 2023-11-21 NOTE — Progress Notes (Signed)
PT taken to PACU. Monitors in place. VSS. Report given to RN.PT taken to PACU. Monitors in place. VSS. Report given to RN.

## 2023-11-21 NOTE — Progress Notes (Signed)
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  CHIEF COMPLAINT:  [facility], Personal history of colon polyps   HPI: Brandi Taylor is a 52 y.o. 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   No past medical history on file.  Past Surgical History:  Procedure Laterality Date   CESAREAN SECTION      Prior to Admission medications   Medication Sig Start Date End Date Taking? Authorizing Provider  norethindrone-ethinyl estradiol-iron (ESTROSTEP FE,TILIA FE,TRI-LEGEST FE) 1-20/1-30/1-35 MG-MCG tablet Take 1 tablet by mouth daily. 05/09/12   Nigel Bridgeman, CNM  Sodium Sulfate-Mag Sulfate-KCl (SUTAB) [phone] MG TABS Take 12 tablets by mouth as directed. 11/03/23   Meryl Dare, MD  triamcinolone cream (KENALOG) 0.1 % Apply 1 Application topically 2 (two) times daily as needed. Patient not taking: Reported on 11/03/2023    [provider]    Current Outpatient Medications  Medication Sig Dispense Refill   norethindrone-ethinyl estradiol-iron (ESTROSTEP FE,TILIA FE,TRI-LEGEST FE) 1-20/1-30/1-35 MG-MCG tablet Take 1 tablet by mouth daily. 1 Package 12   Sodium Sulfate-Mag Sulfate-KCl (SUTAB) [phone] MG TABS Take 12 tablets by mouth as directed. 24 tablet 0   triamcinolone cream (KENALOG) 0.1 % Apply 1 Application topically 2 (two) times daily as needed. (Patient not taking: Reported on 11/03/2023)     No current facility-administered medications for this visit.    Allergies as of 11/21/2023 - Review Complete 11/03/2023  Allergen Reaction Noted   No known allergies  06/26/2020     Family History  Problem Relation Age of Onset   Ovarian cancer Mother    Colon cancer Father    Esophageal cancer Neg Hx    Rectal cancer Neg Hx    Stomach cancer Neg Hx    Colon polyps Neg Hx     Social History   Socioeconomic History   Marital status: Married    Spouse name: Not on file   Number of children: Not on file   Years of education: Not on file   Highest education level: Not on file  Occupational History   Not on file  Tobacco Use   Smoking status: Never   Smokeless tobacco: Never  Vaping Use   Vaping status: Never Used  Substance and Sexual Activity   Alcohol use: No   Drug use: No   Sexual activity: Yes    Birth control/protection: Pill  Other Topics Concern   Not on file  Social History Narrative   Not on file   Social Determinants of Health   Financial Resource Strain: Not on file  Food Insecurity: Not on file  Transportation Needs: Not on file  Physical Activity: Not on file  Stress: Not on file  Social Connections: Not on file  Intimate Partner Violence: Not on file    Review of Systems:  All systems reviewed were negative except where noted in HPI.   Physical Exam:   General:  Alert, well-developed, in NAD Head:  Normocephalic and atraumatic. Eyes:  Sclera clear, no icterus.   Conjunctiva pink. Ears:  Normal auditory acuity. Mouth:  No deformity or lesions.  Neck:  Supple; no masses. Lungs:  Clear throughout to auscultation.  No wheezes, crackles, or rhonchi.  Heart:  Regular rate and rhythm; no murmurs. Abdomen:  Soft, nondistended, nontender. No masses, hepatomegaly. No palpable masses.  Normal bowel sounds.    Rectal:  Deferred   Msk:  Symmetrical without gross deformities. Extremities:  Without edema. Neurologic:  Alert and  oriented x 4; grossly normal neurologically. Skin:  Intact without significant lesions or rashes. Psych:  Alert and cooperative. Normal mood and affect.   Venita Lick. Russella Dar  11/21/2023, 12:36  PM See Loretha Stapler, [facility], to contact our on call provider

## 2023-11-21 NOTE — Patient Instructions (Addendum)
- Repeat colonoscopy date to be determined after                            pending pathology results are reviewed for                            surveillance, likely 3 years, based on pathology                            results.                           - Patient has a contact number available for                            emergencies. The signs and symptoms of potential                            delayed complications were discussed with the                            patient. Return to normal activities tomorrow.                            Written discharge instructions were provided to the                            patient.                           - Resume previous diet.                           - Continue present medications.                           - Await pathology results.  YOU HAD AN ENDOSCOPIC PROCEDURE TODAY AT THE [facility]:   Refer to the procedure report that was given to you for any specific questions about what was found during the examination.  If the procedure report does not answer your questions, please call your gastroenterologist to clarify.  If you requested that your care partner not be given the details of your procedure findings, then the procedure report has been included in a sealed envelope for you to review at your convenience later.  YOU SHOULD EXPECT: Some feelings of bloating in the abdomen. Passage of more gas than usual.  Walking can help get rid of the air that was put into your GI tract during the procedure and reduce the bloating. If you had a lower endoscopy (such as a colonoscopy or flexible sigmoidoscopy) you may notice spotting of blood in your stool or on the toilet paper. If you underwent a bowel prep for your procedure, you may not have a normal bowel movement for a few days.  Please Note:  You might notice some irritation and congestion in your nose or some drainage.  This is from the oxygen used during  your procedure.  There is no need for concern and  it should clear up in a day or so.  SYMPTOMS TO REPORT IMMEDIATELY:  Following lower endoscopy (colonoscopy or flexible sigmoidoscopy):  Excessive amounts of blood in the stool  Significant tenderness or worsening of abdominal pains  Swelling of the abdomen that is new, acute  Fever of 100F or higher   For urgent or emergent issues, a gastroenterologist can be reached at any hour by calling [phone]. Do not use MyChart messaging for urgent concerns.    DIET:  We do recommend a small meal at first, but then you may proceed to your regular diet.  Drink plenty of fluids but you should avoid alcoholic beverages for 24 hours.  ACTIVITY:  You should plan to take it easy for the rest of today and you should NOT DRIVE or use heavy machinery until tomorrow (because of the sedation medicines used during the test).    FOLLOW UP: Our staff will call the number listed on your records the next business day following your procedure.  We will call around 7:15- 8:00 am to check on you and address any questions or concerns that you may have regarding the information given to you following your procedure. If we do not reach you, we will leave a message.     If any biopsies were taken you will be contacted by phone or by letter within the next 1-3 weeks.  Please call us at [phone] if you have not heard about the biopsies in 3 weeks.    SIGNATURES/CONFIDENTIALITY: You and/or your care partner have signed paperwork which will be entered into your electronic medical record.  These signatures attest to the fact that that the information above on your After Visit Summary has been reviewed and is understood.  Full responsibility of the confidentiality of this discharge information lies with you and/or your care-partner.

## 2023-11-21 NOTE — Progress Notes (Signed)
Called to room to assist during endoscopic procedure.  Patient ID and intended procedure confirmed with present staff. Received instructions for my participation in the procedure from the performing physician.  

## 2023-11-21 NOTE — Op Note (Signed)
[facility] Patient Name: Brandi Taylor Procedure Date: 11/21/2023 1:36 PM MRN: [id number] Endoscopist: Meryl Dare , MD, [phone] Age: 52 Referring MD:  Date of Birth: [birth year] Gender: Female Account #: [id number] Procedure:                Colonoscopy Indications:              Surveillance: Personal history of adenomatous                            polyps on last colonoscopy > 3 years ago, Family                            history of colon cancer, 1st-degree relative Medicines:                Monitored Anesthesia Care Procedure:                Pre-Anesthesia Assessment:                           - Prior to the procedure, a History and Physical                            was performed, and patient medications and                            allergies were reviewed. The patient's tolerance of                            previous anesthesia was also reviewed. The risks                            and benefits of the procedure and the sedation                            options and risks were discussed with the patient.                            All questions were answered, and informed consent                            was obtained. Prior Anticoagulants: The patient has                            taken no anticoagulant or antiplatelet agents. ASA                            Grade Assessment: II - A patient with mild systemic                            disease. After reviewing the risks and benefits,                            the patient was deemed in satisfactory condition to  undergo the procedure.                           After obtaining informed consent, the colonoscope                            was passed under direct vision. Throughout the                            procedure, the patient's blood pressure, pulse, and                            oxygen saturations were monitored continuously. The                            Olympus Scope  SN [id number] was introduced through the                            anus and advanced to the the cecum, identified by                            appendiceal orifice and ileocecal valve. The                            ileocecal valve, appendiceal orifice, and rectum                            were photographed. The quality of the bowel                            preparation was excellent. The colonoscopy was                            performed without difficulty. The patient tolerated                            the procedure well. Scope In: 1:38:00 PM Scope Out: 1:56:22 PM Scope Withdrawal Time: 0 hours 14 minutes 12 seconds  Total Procedure Duration: 0 hours 18 minutes 22 seconds  Findings:                 The perianal and digital rectal examinations were                            normal.                           A 4 mm polyp was found in the transverse colon. The                            polyp was sessile. The polyp was removed with a                            cold snare. Resection and retrieval were complete.  A 12 mm polyp was found in the descending colon.                            The polyp was pedunculated. The polyp was removed                            with a cold snare. Resection and retrieval were                            complete.                           The exam was otherwise without abnormality on                            direct and retroflexion views. Complications:            No immediate complications. Estimated blood loss:                            None. Estimated Blood Loss:     Estimated blood loss: none. Impression:               - One 4 mm polyp in the transverse colon, removed                            with a cold snare. Resected and retrieved.                           - One 12 mm polyp in the descending colon, removed                            with a cold snare. Resected and retrieved.                           - The  examination was otherwise normal on direct                            and retroflexion views. Recommendation:           - Repeat colonoscopy date to be determined after                            pending pathology results are reviewed for                            surveillance, likely 3 years, based on pathology                            results.                           - Patient has a contact number available for                            emergencies. The  signs and symptoms of potential                            delayed complications were discussed with the                            patient. Return to normal activities tomorrow.                            Written discharge instructions were provided to the                            patient.                           - Resume previous diet.                           - Continue present medications.                           - Await pathology results. Meryl Dare, MD 11/21/2023 2:02:40 PM This report has been signed electronically.

## 2023-11-22 ENCOUNTER — Telehealth: Payer: Self-pay

## 2023-11-22 NOTE — Telephone Encounter (Signed)
  Follow up Call-     11/21/2023   12:52 PM  Call back number  Post procedure Call Back phone  # [phone]  Permission to leave phone message Yes     Patient questions:  Do you have a fever, pain , or abdominal swelling? No. Pain Score  0 *  Have you tolerated food without any problems? Yes.    Have you been able to return to your normal activities? Yes.    Do you have any questions about your discharge instructions: Diet   No. Medications  No. Follow up visit  No.  Do you have questions or concerns about your Care? No.  Actions: * If pain score is 4 or above: No action needed, pain <4.

## 2023-11-28 LAB — SURGICAL PATHOLOGY

## 2023-11-29 ENCOUNTER — Encounter: Payer: Self-pay | Admitting: Gastroenterology

## 2023-12-07 ENCOUNTER — Encounter: Payer: 59 | Admitting: Gastroenterology

## 2024-10-03 ENCOUNTER — Other Ambulatory Visit: Payer: Self-pay | Admitting: Obstetrics and Gynecology

## 2024-10-03 DIAGNOSIS — Z1231 Encounter for screening mammogram for malignant neoplasm of breast: Secondary | ICD-10-CM

## 2024-10-14 ENCOUNTER — Ambulatory Visit
Admission: RE | Admit: 2024-10-14 | Discharge: 2024-10-14 | Disposition: A | Discharge status: Home / Self Care | Source: Ambulatory Visit

## 2024-10-14 DIAGNOSIS — Z1231 Encounter for screening mammogram for malignant neoplasm of breast: Secondary | ICD-10-CM
